# Patient Record
Sex: Female | Born: 1985 | Race: Black or African American | Hispanic: No | Marital: Single | Smoking: Never smoker
Health system: Southern US, Community
[De-identification: ages and names within clinical notes are randomized; demographics above are authoritative.]

## PROBLEM LIST (undated history)

## (undated) DIAGNOSIS — G43909 Migraine, unspecified, not intractable, without status migrainosus: Secondary | ICD-10-CM

## (undated) HISTORY — PX: MOUTH SURGERY: SHX715

---
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
## 2011-10-31 ENCOUNTER — Ambulatory Visit: Payer: 59 | Admitting: Family Medicine

## 2011-10-31 VITALS — BP 121/81 | HR 112 | Temp 98.2°F | Resp 16 | Ht 64.0 in | Wt 196.8 lb

## 2011-10-31 DIAGNOSIS — J019 Acute sinusitis, unspecified: Secondary | ICD-10-CM

## 2011-10-31 MED ORDER — AMOXICILLIN 500 MG PO CAPS: 1000.0000 mg | ORAL_CAPSULE | Freq: Two times a day (BID) | ORAL | Status: AC

## 2011-10-31 NOTE — Progress Notes (Signed)
  Patient Name: Sue Hickman Date of Birth: 1986-03-11 Medical Record Number: [id number] Gender: female Date of Encounter: 10/31/2011  History of Present Illness:  Sue Hickman is a 26 y.o. very pleasant female patient who presents with the following:  Here with illness- congestion in her sinuses, eyes hurt for the last week.  Also has hoarse voice for 3 days.  Today had a nose bleed that kept coming back.  She could control the bleeding in a minute or two.  Last bled a couple of hours ago.  Mild cough.  No fever that she had noticed, does have chills and body aches.  Did have a sore throat a few days ago.  No GI symptoms.  LMP = 10/10/11 Also requests FMLA paperwork- "I work for [employer] and they don't accept doctor's notes- only FMLA."  Is to RTW tomorrow after a vacation and will work tomorrow (saturday) through next Friday.  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   There is no problem list on file for this patient.  History reviewed. No pertinent past medical history. Past Surgical History  Procedure Date  . Mouth surgery    History  Substance Use Topics  . Smoking status: Never Smoker   . Smokeless tobacco: Never Used  . Alcohol Use: No   History reviewed. No pertinent family history. No Known Allergies  Medication list has been reviewed and updated.  Review of Systems: As per HPI- otherwise negative.  No other bleeding such as bleeding gums or easy bruising.    Physical Examination: Filed Vitals:   10/31/11 1428  BP: 121/81  Pulse: 112  Temp: 98.2 F (36.8 C)  TempSrc: Oral  Resp: 16  Height: 5\' 4"  (1.626 m)  Weight: 196 lb 12.8 oz (89.268 kg)  SpO2: 100%   Pulse 90 at my exam Body mass index is 33.78 kg/(m^2).  GEN: WDWN, NAD, Non-toxic, A & O x 3, obese HEENT: Atraumatic, Normocephalic. Neck supple. No masses, No LAD.  Oropharynx wnl, tender at frontal sinuses bilaterally Ears and Nose: No external  deformity.  Nasal cavity ok, TM wnl bilaterally CV: RRR, No M/G/R. No JVD. No thrill. No extra heart sounds. PULM: CTA B, no wheezes, crackles, rhonchi. No retractions. No resp. distress. No accessory muscle use. EXTR: No c/c/e NEURO Normal gait.  PSYCH: Normally interactive. Conversant. Not depressed or anxious appearing.  Calm demeanor.    Assessment and Plan: 1. Sinusitis acute  amoxicillin (AMOXIL) 500 MG capsule   Treat for sinusitis as above.  She does not have a dangerous illness, but is incapacitated from her job because she cannot speak.  Her voice is very hoarse/ soft and difficult to hear.  I did do FMLA paperwork for her, with plans to RTW on 2/20.  She will come in for a recheck a day or two before her RTW date for a recheck.  If she has more trouble with nosebleeds or cannot control the bleeding come in or otherwise seek care.  Otherwise, vaseline as a moisturizer may be helpful for her nosebleeds.

## 2011-10-31 NOTE — Patient Instructions (Signed)
Rest, fluids, try mucinex to loosen mucus.  Use antibiotic as directed.

## 2011-11-03 ENCOUNTER — Ambulatory Visit: Payer: 59 | Admitting: Family Medicine

## 2011-11-03 VITALS — BP 137/83 | HR 102 | Temp 98.3°F | Resp 18

## 2011-11-03 DIAGNOSIS — J45909 Unspecified asthma, uncomplicated: Secondary | ICD-10-CM

## 2011-11-03 DIAGNOSIS — J9801 Acute bronchospasm: Secondary | ICD-10-CM

## 2011-11-03 MED ORDER — ALBUTEROL SULFATE HFA 108 (90 BASE) MCG/ACT IN AERS: 2.0000 | INHALATION_SPRAY | Freq: Four times a day (QID) | RESPIRATORY_TRACT | Status: DC | PRN

## 2011-11-03 NOTE — Progress Notes (Signed)
  Patient Name: Sue Hickman Date of Birth: 17-Mar-1986 Medical Record Number: [id number] Gender: female Date of Encounter: 11/03/2011  History of Present Illness:  Sue Hickman is a 26 y.o. very pleasant female patient who presents with the following:  Here to recheck illness. See last visit on 10/31/11. Feels overall better but notes that "my chest is hurting" for the last 2 days when she coughs or is exposed to cold air.  No fever that she has noticed.  No aches, few chills.  Still has cough.    A/P from last visit: Assessment and Plan:  1.  Sinusitis acute  amoxicillin (AMOXIL) 500 MG capsule    Treat for sinusitis as above. She does not have a dangerous illness, but is incapacitated from her job because she cannot speak. Her voice is very hoarse/ soft and difficult to hear. I did do FMLA paperwork for her, with plans to RTW on 2/20. She will come in for a recheck a day or two before her RTW date for a recheck. If she has more trouble with nosebleeds or cannot control the bleeding come in or otherwise seek care. Otherwise, vaseline as a moisturizer may be helpful for her nosebleeds.    There is no problem list on file for this patient.  History reviewed. No pertinent past medical history. Past Surgical History  Procedure Date  . Mouth surgery    History  Substance Use Topics  . Smoking status: Never Smoker   . Smokeless tobacco: Never Used  . Alcohol Use: No   History reviewed. No pertinent family history. No Known Allergies  Medication list has been reviewed and updated.  Review of Systems: As per HPI, otherwise negative  Physical Examination: Filed Vitals:   11/03/11 0837  BP: 137/83  Pulse: 102  Temp: 98.3 F (36.8 C)  TempSrc: Oral  Resp: 18  SpO2: 99%   Recheck pulse around 95 There is no height or weight on file to calculate BMI.  GEN: WDWN, NAD, Non-toxic, A & O x 3, obese HEENT: Atraumatic, Normocephalic. Neck supple. No masses, No LAD. Ears and  Nose: No external deformity. CV: RRR, No M/G/R. No JVD. No thrill. No extra heart sounds.  reproducable CP byu pressing on sternum.  PULM: CTA B, no wheezes, crackles, rhonchi. No retractions. No resp. distress. No accessory muscle use. EXTR: No c/c/e NEURO Normal gait.  PSYCH: Normally interactive. Conversant. Not depressed or anxious appearing.  Calm demeanor.    Assessment and Plan: Recovering from sinusits, some RAD due to illness and very cold weather.    Her voice is much better so she should be able to RTW on 2/20 as planned.   1. RAD (reactive airway disease)  albuterol (PROVENTIL HFA;VENTOLIN HFA) 108 (90 BASE) MCG/ACT inhaler   Let us know if not continuing to get better- Sooner if worse.

## 2012-01-26 ENCOUNTER — Ambulatory Visit (INDEPENDENT_AMBULATORY_CARE_PROVIDER_SITE_OTHER): Payer: 59 | Admitting: Family Medicine

## 2012-01-26 ENCOUNTER — Encounter: Payer: Self-pay | Admitting: Family Medicine

## 2012-01-26 VITALS — BP 136/80 | HR 92 | Temp 97.5°F | Resp 16 | Ht 64.0 in | Wt 199.0 lb

## 2012-01-26 DIAGNOSIS — R040 Epistaxis: Secondary | ICD-10-CM

## 2012-01-26 DIAGNOSIS — J069 Acute upper respiratory infection, unspecified: Secondary | ICD-10-CM

## 2012-01-26 DIAGNOSIS — J329 Chronic sinusitis, unspecified: Secondary | ICD-10-CM

## 2012-01-26 LAB — POCT CBC
HCT, POC: 36.6 % — AB (ref 37.7–47.9)
Hemoglobin: 11.7 g/dL — AB (ref 12.2–16.2)
Lymph, poc: 3 (ref 0.6–3.4)
MCH, POC: 27.3 pg (ref 27–31.2)
MCHC: 32 g/dL (ref 31.8–35.4)
MCV: 85.3 fL (ref 80–97)
MID (cbc): 0.8 (ref 0–0.9)
POC Granulocyte: 8.5 — AB (ref 2–6.9)
POC LYMPH PERCENT: 24.1 %L (ref 10–50)
POC MID %: 6.8 %M (ref 0–12)
Platelet Count, POC: 464 10*3/uL — AB (ref 142–424)
RBC: 4.29 M/uL (ref 4.04–5.48)
RDW, POC: 14.7 %
WBC: 12.3 10*3/uL — AB (ref 4.6–10.2)

## 2012-01-26 MED ORDER — AZITHROMYCIN 250 MG PO TABS: ORAL_TABLET | ORAL | Status: AC

## 2012-01-26 NOTE — Patient Instructions (Addendum)
Nosebleed Nosebleeds can be caused by many conditions including trauma, infections, polyps, foreign bodies, dry mucous membranes or climate, medications and air conditioning. Most nosebleeds occur in the front of the nose. It is because of this location that most nosebleeds can be controlled by pinching the nostrils gently and continuously. Do this for at least 10 to 20 minutes. The reason for this long continuous pressure is that you must hold it long enough for the blood to clot. If during that 10 to 20 minute time period, pressure is released, the process may have to be started again. The nosebleed may stop by itself, quit with pressure, need concentrated heating (cautery) or stop with pressure from packing. HOME CARE INSTRUCTIONS   If your nose was packed, try to maintain the pack inside until your caregiver removes it. If a gauze pack was used and it starts to fall out, gently replace or cut the end off. Do not cut if a balloon catheter was used to pack the nose. Otherwise, do not remove unless instructed.   Avoid blowing your nose for 12 hours after treatment. This could dislodge the pack or clot and start bleeding again.   If the bleeding starts again, sit up and bending forward, gently pinch the front half of your nose continuously for 20 minutes.   If bleeding was caused by dry mucous membranes, cover the inside of your nose every morning with a petroleum or antibiotic ointment. Use your little fingertip as an applicator. Do this as needed during dry weather. This will keep the mucous membranes moist and allow them to heal.   Maintain humidity in your home by using less air conditioning or using a humidifier.   Do not use aspirin or medications which make bleeding more likely. Your caregiver can give you recommendations on this.   Resume normal activities as able but try to avoid straining, lifting or bending at the waist for several days.   If the nosebleeds become recurrent and the cause  is unknown, your caregiver may suggest laboratory tests.  SEEK IMMEDIATE MEDICAL CARE IF:   Bleeding recurs and cannot be controlled.   There is unusual bleeding from or bruising on other parts of the body.   You have a fever.   Nosebleeds continue.   There is any worsening of the condition which originally brought you in.   You become lightheaded, feel faint, become sweaty or vomit blood.  MAKE SURE YOU:   Understand these instructions.   Will watch your condition.   Will get help right away if you are not doing well or get worse.  Document Released: 06/11/2005 Document Revised: 08/21/2011 Document Reviewed: 08/03/2009 [facility] Patient Information 2012 [place], [place].   Use Afrin 2 sprays each nostril twice daily for 3 days only.  Antibiotics for infection  Use a little polysporin in each side of the nose 2-3 times daily.

## 2012-01-26 NOTE — Progress Notes (Signed)
Subjective: Patient is here complaining of having a little upper [place] congestion sore throat for 2 days ago. Yesterday she got up he noticed and was working a lot. Last I started bleeding lateral might off and on. She comes in here with a nosebleed. She only had a nosebleed one of the time and that was with a sinus infection.  Objective: Blood pressure 132/84. Alert but anxious looking overweight Afro-American female. Nose has a little blood coming from both nares was present, but doesn't seem to be dripping out of her. Throat was clear. Neck supple without nodes.  Assessment:  Epistaxis URI  Plan: With it not bleeding actively right now I don't think any to pack it. We will give her a little time and we'll check CBC and proceed from there.  Will put a little afrin on cotton up each nares to try to open the nose and stop the bleed.  Afrin packing removed after about 8 minutes.  Opened up some. Not bleeding.  Results for orders placed in visit on 01/26/12  POCT CBC      Component Value Range   WBC 12.3 (*) 4.6 - 10.2 (K/uL)   Lymph, poc 3.0  0.6 - 3.4    POC LYMPH PERCENT 24.1  10 - 50 (%L)   MID (cbc) 0.8  0 - 0.9    POC MID % 6.8  0 - 12 (%M)   POC Granulocyte 8.5 (*) 2 - 6.9    Granulocyte percent 69.1  37 - 80 (%G)   RBC 4.29  4.04 - 5.48 (M/uL)   Hemoglobin 11.7 (*) 12.2 - 16.2 (g/dL)   HCT, POC 11.9 (*) 14.7 - 47.9 (%)   MCV 85.3  80 - 97 (fL)   MCH, POC 27.3  27 - 31.2 (pg)   MCHC 32.0  31.8 - 35.4 (g/dL)   RDW, POC 82.9     Platelet Count, POC 464 (*) 142 - 424 (K/uL)   MPV 7.8  0 - 99.8 (fL)    The nose seems to stop bleeding with the Afrin. Her white blood count is a little bit high indicating that she might have some sinusitis. I will treat her with antibiotics. Use OTC Afrin for 3 days only. Instructions on squeezing of nose but she believes.

## 2019-02-24 ENCOUNTER — Emergency Department (HOSPITAL_COMMUNITY)
Admission: EM | Admit: 2019-02-24 | Discharge: 2019-02-24 | Disposition: A | Discharge status: Home / Self Care | Payer: Self-pay | Attending: Emergency Medicine | Admitting: Emergency Medicine

## 2019-02-24 ENCOUNTER — Encounter (HOSPITAL_COMMUNITY): Payer: Self-pay

## 2019-02-24 ENCOUNTER — Other Ambulatory Visit: Payer: Self-pay

## 2019-02-24 DIAGNOSIS — R519 Headache, unspecified: Secondary | ICD-10-CM

## 2019-02-24 DIAGNOSIS — R112 Nausea with vomiting, unspecified: Secondary | ICD-10-CM | POA: Insufficient documentation

## 2019-02-24 DIAGNOSIS — R51 Headache: Secondary | ICD-10-CM | POA: Insufficient documentation

## 2019-02-24 HISTORY — DX: Migraine, unspecified, not intractable, without status migrainosus: G43.909

## 2019-02-24 NOTE — ED Notes (Signed)
Pt reports that earlier she had a migraine that was causing her to vomit. Pt denies pain currently and reports no symptoms. Requesting to be discharged.

## 2019-02-24 NOTE — ED Notes (Signed)
Pt discharged with all belongings. Discharge instructions reviewed with pt, pt verbalized understanding. Opportunity for questions provided. Pt ambulatory at discharge.

## 2019-02-24 NOTE — Discharge Instructions (Addendum)
Please read instructions below. 10 you taking over-the-counter medications as needed for your headaches. Schedule an appointment with your primary care provider to follow up on your headache and discuss preventative treatment. Return to the ER for severely worsening headache, vision changes, fever, weakness or numbness, or new or concerning symptoms.

## 2019-02-24 NOTE — ED Provider Notes (Signed)
[facility] EMERGENCY DEPARTMENT Provider Note   CSN: [id number] Arrival date & time: 02/24/19  2123    History   Chief Complaint Chief Complaint  Patient presents with  . Migraine    HPI Sue Hickman is a 33 y.o. female w PMHx migraine HA, presenting to the ED with complaint of headache that resolved prior to evaluation.  She states she had a headache that began yesterday, typical for her migraine headache, generalized, throbbing with associated photophobia, nausea.  She states this headache however she did have some vomiting with the nausea.  She took ibuprofen prior to arrival and upon evaluation she reports symptoms have completely resolved, she feels well and ready for discharge.  Her primary care manages her migraines, she has been prescribed tramadol for this as well as a nausea medicine, however she rarely needs this.  No diplopia or loss of vision.  No neck pain or stiffness.  No other complaints.     The history is provided by the patient.    Past Medical History:  Diagnosis Date  . Migraine     There are no active problems to display for this patient.   Past Surgical History:  Procedure Laterality Date  . MOUTH SURGERY       OB History   No obstetric history on file.      Home Medications    Prior to Admission medications   Medication Sig Start Date End Date Taking? Authorizing Provider  acetaminophen (TYLENOL) 500 MG tablet Take 500 mg by mouth every 6 (six) hours as needed.    [provider]  albuterol (PROVENTIL HFA;VENTOLIN HFA) 108 (90 BASE) MCG/ACT inhaler Inhale 2 puffs into the lungs every 6 (six) hours as needed for wheezing. 11/03/11 11/02/12  Copland, Gay Filler, MD  dextromethorphan-guaiFENesin (MUCINEX DM) 30-600 MG per 12 hr tablet Take 1 tablet by mouth every 12 (twelve) hours.    [provider]    Family History No family history on file.  Social History Social History   Tobacco Use  . Smoking  status: Never Smoker  . Smokeless tobacco: Never Used  Substance Use Topics  . Alcohol use: Yes    Comment: Socially   . Drug use: No     Allergies   Patient has no known allergies.   Review of Systems Review of Systems  Eyes: Positive for photophobia. Negative for visual disturbance.  Gastrointestinal: Positive for nausea and vomiting.  Neurological: Positive for headaches.  All other systems reviewed and are negative.    Physical Exam Updated Vital Signs BP (!) 146/95 (BP Location: Left Arm)   Pulse 82   Temp 98.7 F (37.1 C) (Oral)   Resp 18   Ht 5\' 3"  (1.6 m)   Wt 90.7 kg   SpO2 98%   BMI 35.43 kg/m   Physical Exam Vitals signs and nursing note reviewed.  Constitutional:      General: She is not in acute distress.    Appearance: She is well-developed. She is not ill-appearing.  HENT:     Head: Normocephalic and atraumatic.  Eyes:     Extraocular Movements: Extraocular movements intact.     Conjunctiva/sclera: Conjunctivae normal.  Cardiovascular:     Rate and Rhythm: Normal rate and regular rhythm.  Pulmonary:     Effort: Pulmonary effort is normal. No respiratory distress.  Abdominal:     Palpations: Abdomen is soft.  Skin:    General: Skin is warm.  Neurological:  Mental Status: She is alert.  Psychiatric:        Behavior: Behavior normal.      ED Treatments / Results  Labs (all labs ordered are listed, but only abnormal results are displayed) Labs Reviewed - No data to display  EKG None  Radiology No results found.  Procedures Procedures (including critical care time)  Medications Ordered in ED Medications - No data to display   Initial Impression / Assessment and Plan / ED Course  I have reviewed the triage vital signs and the nursing notes.  Pertinent labs & imaging results that were available during my care of the patient were reviewed by me and considered in my medical decision making (see chart for details).         Patient presenting with complaint of migraine headache, however resolved her to my evaluation.  Treated at home with ibuprofen prior to arrival.  No current complaints.  Patient declined examination, states she is ready for discharge.  Instructed PCP follow-up.  Return precautions.  Safe for discharge.  Discussed results, findings, treatment and follow up. Patient advised of return precautions. Patient verbalized understanding and agreed with plan.   Final Clinical Impressions(s) / ED Diagnoses   Final diagnoses:  Acute nonintractable headache, unspecified headache type    ED Discharge Orders    None       , [place] N, PA-C 02/24/19 2234    Little, Ambrose Finlandachel Morgan, MD 02/25/19 0009

## 2019-02-24 NOTE — ED Triage Notes (Signed)
Pt in POV reporting migraine X1 day. Episode of N/V PTA, photosensitivity. Pt states hx of migraines, has Rx for Tramadol but has not taken it, did try Advil PTA.

## 2019-03-04 ENCOUNTER — Emergency Department (HOSPITAL_COMMUNITY): Payer: BC Managed Care – PPO

## 2019-03-04 ENCOUNTER — Emergency Department (HOSPITAL_COMMUNITY)
Admission: EM | Admit: 2019-03-04 | Discharge: 2019-03-04 | Disposition: A | Discharge status: Home / Self Care | Payer: BC Managed Care – PPO | Attending: Emergency Medicine | Admitting: Emergency Medicine

## 2019-03-04 ENCOUNTER — Encounter (HOSPITAL_COMMUNITY): Payer: Self-pay

## 2019-03-04 ENCOUNTER — Other Ambulatory Visit: Payer: Self-pay

## 2019-03-04 DIAGNOSIS — Z20828 Contact with and (suspected) exposure to other viral communicable diseases: Secondary | ICD-10-CM | POA: Diagnosis not present

## 2019-03-04 DIAGNOSIS — R509 Fever, unspecified: Secondary | ICD-10-CM | POA: Insufficient documentation

## 2019-03-04 DIAGNOSIS — B349 Viral infection, unspecified: Secondary | ICD-10-CM | POA: Diagnosis not present

## 2019-03-04 DIAGNOSIS — M791 Myalgia, unspecified site: Secondary | ICD-10-CM | POA: Diagnosis not present

## 2019-03-04 DIAGNOSIS — R070 Pain in throat: Secondary | ICD-10-CM | POA: Diagnosis not present

## 2019-03-04 DIAGNOSIS — R05 Cough: Secondary | ICD-10-CM | POA: Diagnosis present

## 2019-03-04 LAB — TROPONIN I: Troponin I: <0.03 ng/mL (ref <0.03)

## 2019-03-04 LAB — BASIC METABOLIC PANEL
Anion gap: 13 (ref 5–15)
BUN: 7 mg/dL (ref 6–20)
CO2: 21 mmol/L — ABNORMAL LOW (ref 22–32)
Calcium: 9.7 mg/dL (ref 8.9–10.3)
Chloride: 105 mmol/L (ref 98–111)
Creatinine, Ser: 0.97 mg/dL (ref 0.44–1.00)
GFR calc Af Amer: >60 mL/min (ref >60)
GFR calc non Af Amer: >60 mL/min (ref >60)
Glucose, Bld: 138 mg/dL — ABNORMAL HIGH (ref 70–99)
Potassium: 4.6 mmol/L (ref 3.5–5.1)
Sodium: 139 mmol/L (ref 135–145)

## 2019-03-04 LAB — CBC
HCT: 41.4 % (ref 36.0–46.0)
Hemoglobin: 13.6 g/dL (ref 12.0–15.0)
MCH: 29.1 pg (ref 26.0–34.0)
MCHC: 32.9 g/dL (ref 30.0–36.0)
MCV: 88.5 fL (ref 80.0–100.0)
Platelets: 397 10*3/uL (ref 150–400)
RBC: 4.68 MIL/uL (ref 3.87–5.11)
RDW: 12.7 % (ref 11.5–15.5)
WBC: 16.7 10*3/uL — ABNORMAL HIGH (ref 4.0–10.5)
nRBC: 0 % (ref 0.0–0.2)

## 2019-03-04 LAB — I-STAT BETA HCG BLOOD, ED (MC, WL, AP ONLY): I-stat hCG, quantitative: <5 m[IU]/mL (ref <5)

## 2019-03-04 LAB — GROUP A STREP BY PCR: Group A Strep by PCR: NOT DETECTED

## 2019-03-04 MED ORDER — ACETAMINOPHEN 325 MG PO TABS
650.0000 mg | ORAL_TABLET | Freq: Once | ORAL | Status: AC
  Administered 2019-03-04: 650 mg via ORAL
  Filled 2019-03-04: qty 2

## 2019-03-04 MED ORDER — SODIUM CHLORIDE 0.9% FLUSH
3.0000 mL | Freq: Once | INTRAVENOUS | Status: AC
  Administered 2019-03-04: 21:00:00 3 mL via INTRAVENOUS

## 2019-03-04 MED ORDER — SODIUM CHLORIDE 0.9 % IV BOLUS
1000.0000 mL | Freq: Once | INTRAVENOUS | Status: AC
  Administered 2019-03-04: 1000 mL via INTRAVENOUS

## 2019-03-04 NOTE — ED Notes (Signed)
Patient verbalizes understanding of discharge instructions. Opportunity for questioning and answers were provided. Armband removed by staff, pt discharged from ED.  

## 2019-03-04 NOTE — ED Triage Notes (Signed)
Pt reports generalized body aches, sore throat and fever for the past 2 days.

## 2019-03-04 NOTE — ED Provider Notes (Signed)
[facility] EMERGENCY DEPARTMENT Provider Note   CSN: [id number] Arrival date & time: 03/04/19  1938    History   Chief Complaint Chief Complaint  Patient presents with  . Generalized Body Aches  . Fever  . Sore Throat    HPI Sue Hickman is a 33 y.o. female.     HPI Pt started having sx a few days ago.   She has been coughing, having body aches, sore throat and today she started with a fever.No dysuria.   No frequency.  No abdominal pain.  No vomiting, some loose stools a couple of days ago.   Nothing today.  Pt denies any known ill contacts.  No menses.  She is on nexplanon. Past Medical History:  Diagnosis Date  . Migraine     There are no active problems to display for this patient.   Past Surgical History:  Procedure Laterality Date  . MOUTH SURGERY       OB History   No obstetric history on file.      Home Medications    Prior to Admission medications   Medication Sig Start Date End Date Taking? Authorizing Provider  acetaminophen (TYLENOL) 500 MG tablet Take 500 mg by mouth every 6 (six) hours as needed.    [provider]  albuterol (PROVENTIL HFA;VENTOLIN HFA) 108 (90 BASE) MCG/ACT inhaler Inhale 2 puffs into the lungs every 6 (six) hours as needed for wheezing. 11/03/11 11/02/12  Copland, Gay Filler, MD  dextromethorphan-guaiFENesin (MUCINEX DM) 30-600 MG per 12 hr tablet Take 1 tablet by mouth every 12 (twelve) hours.    [provider]    Family History No family history on file.  Social History Social History   Tobacco Use  . Smoking status: Never Smoker  . Smokeless tobacco: Never Used  Substance Use Topics  . Alcohol use: Yes    Comment: Socially   . Drug use: No     Allergies   Patient has no known allergies.   Review of Systems Review of Systems  All other systems reviewed and are negative.    Physical Exam Updated Vital Signs BP (!) 133/91   Pulse (!) 101   Temp 99.3 F (37.4 C)  (Oral)   Resp 17   SpO2 99%   Physical Exam Vitals signs and nursing note reviewed.  Constitutional:      General: She is not in acute distress.    Appearance: She is well-developed.  HENT:     Head: Normocephalic and atraumatic.     Right Ear: External ear normal.     Left Ear: External ear normal.     Mouth/Throat:     Mouth: Mucous membranes are moist.     Pharynx: No pharyngeal swelling, oropharyngeal exudate, posterior oropharyngeal erythema or uvula swelling.  Eyes:     General: No scleral icterus.       Right eye: No discharge.        Left eye: No discharge.     Conjunctiva/sclera: Conjunctivae normal.  Neck:     Musculoskeletal: Neck supple.     Trachea: No tracheal deviation.  Cardiovascular:     Rate and Rhythm: Normal rate and regular rhythm.  Pulmonary:     Effort: Pulmonary effort is normal. No respiratory distress.     Breath sounds: Normal breath sounds. No stridor. No wheezing or rales.  Abdominal:     General: Bowel sounds are normal. There is no distension.  Palpations: Abdomen is soft.     Tenderness: There is no abdominal tenderness. There is no guarding or rebound.  Musculoskeletal:        General: No tenderness.  Skin:    General: Skin is warm and dry.     Findings: No rash.  Neurological:     Mental Status: She is alert.     Cranial Nerves: No cranial nerve deficit (no facial droop, extraocular movements intact, no slurred speech).     Sensory: No sensory deficit.     Motor: No abnormal muscle tone or seizure activity.     Coordination: Coordination normal.      ED Treatments / Results  Labs (all labs ordered are listed, but only abnormal results are displayed) Labs Reviewed  BASIC METABOLIC PANEL - Abnormal; Notable for the following components:      Result Value   CO2 21 (*)    Glucose, Bld 138 (*)    All other components within normal limits  CBC - Abnormal; Notable for the following components:   WBC 16.7 (*)    All other  components within normal limits  GROUP A STREP BY PCR  NOVEL CORONAVIRUS, NAA (HOSPITAL ORDER, SEND-OUT TO REF LAB)  TROPONIN I  I-STAT BETA HCG BLOOD, ED ([facility], [facility], [facility] ONLY)    EKG EKG Interpretation  Date/Time:  Friday March 04 2019 19:48:44 EDT Ventricular Rate:  134 PR Interval:  124 QRS Duration: 70 QT Interval:  306 QTC Calculation: 456 R Axis:   78 Text Interpretation:  Sinus tachycardia Possible Left atrial enlargement Borderline ECG No old tracing to compare Confirmed by Linwood DibblesKnapp, Reshanda Lewey [phone]([id number]) on 03/04/2019 8:18:22 PM   Radiology Dg Chest Portable 1 View  Result Date: 03/04/2019 CLINICAL DATA:  Chest tightness EXAM: PORTABLE CHEST 1 VIEW COMPARISON:  08/31/2004 FINDINGS: The heart size and mediastinal contours are within normal limits. Both lungs are clear. The visualized skeletal structures are unremarkable. IMPRESSION: No active disease. Electronically Signed   By: Jasmine PangKim  Fujinaga M.D.   On: 03/04/2019 20:52    Procedures Procedures (including critical care time)  Medications Ordered in ED Medications  sodium chloride flush (NS) 0.9 % injection 3 mL (3 mLs Intravenous Given 03/04/19 2119)  acetaminophen (TYLENOL) tablet 650 mg (650 mg Oral Given 03/04/19 1958)  sodium chloride 0.9 % bolus 1,000 mL (0 mLs Intravenous Stopped 03/04/19 2313)     Initial Impression / Assessment and Plan / ED Course  I have reviewed the triage vital signs and the nursing notes.  Pertinent labs & imaging results that were available during my care of the patient were reviewed by me and considered in my medical decision making (see chart for details).   Patient presented to the emergency room for evaluation of fever cough sore throat and congestion.  Labs are notable for an elevated white blood cell count but otherwise her test results were unremarkable.  Chest x-ray without signs of pneumonia.  Symptoms most likely related to a viral infection.  Certainly cannot exclude the possibility of covid 19.   A swab was sent off for analysis.  Patient was cautioned to quarantine herself.  Take Tylenol as needed for fever.  Warning signs precautions discussed.  Sue Hickman was evaluated in Emergency Department on 03/04/2019 for the symptoms described in the history of present illness. She was evaluated in the context of the global COVID-19 pandemic, which necessitated consideration that the patient might be at risk for infection with the SARS-CoV-2 virus that causes  COVID-19. Institutional protocols and algorithms that pertain to the evaluation of patients at risk for COVID-19 are in a state of rapid change based on information released by regulatory bodies including the CDC and federal and state organizations. These policies and algorithms were followed during the patient's care in the ED.    Final Clinical Impressions(s) / ED Diagnoses   Final diagnoses:  Viral syndrome    ED Discharge Orders    None       Linwood DibblesKnapp, Niki Payment, MD 03/04/19 2316

## 2019-03-04 NOTE — Discharge Instructions (Signed)
Take Tylenol as needed for fever, drink plenty of fluids, please make sure to quarantine yourself.  Your covid test should be back in the next 24 to 48 hours.

## 2019-03-06 LAB — NOVEL CORONAVIRUS, NAA (HOSP ORDER, SEND-OUT TO REF LAB; TAT 18-24 HRS): SARS-CoV-2, NAA: NOT DETECTED

## 2019-12-04 ENCOUNTER — Emergency Department (HOSPITAL_COMMUNITY): Payer: Self-pay

## 2019-12-04 ENCOUNTER — Emergency Department (HOSPITAL_COMMUNITY)
Admission: EM | Admit: 2019-12-04 | Discharge: 2019-12-05 | Disposition: A | Discharge status: Home / Self Care | Payer: Self-pay | Attending: Emergency Medicine | Admitting: Emergency Medicine

## 2019-12-04 ENCOUNTER — Other Ambulatory Visit: Payer: Self-pay

## 2019-12-04 ENCOUNTER — Encounter (HOSPITAL_COMMUNITY): Payer: Self-pay | Admitting: Emergency Medicine

## 2019-12-04 DIAGNOSIS — R0602 Shortness of breath: Secondary | ICD-10-CM

## 2019-12-04 DIAGNOSIS — J1282 Pneumonia due to coronavirus disease 2019: Secondary | ICD-10-CM | POA: Insufficient documentation

## 2019-12-04 DIAGNOSIS — U071 COVID-19: Secondary | ICD-10-CM | POA: Insufficient documentation

## 2019-12-04 LAB — COMPREHENSIVE METABOLIC PANEL
ALT: 201 U/L — ABNORMAL HIGH (ref 0–44)
AST: 158 U/L — ABNORMAL HIGH (ref 15–41)
Albumin: 3.4 g/dL — ABNORMAL LOW (ref 3.5–5.0)
Alkaline Phosphatase: 123 U/L (ref 38–126)
Anion gap: 13 (ref 5–15)
BUN: 6 mg/dL (ref 6–20)
CO2: 24 mmol/L (ref 22–32)
Calcium: 8.5 mg/dL — ABNORMAL LOW (ref 8.9–10.3)
Chloride: 101 mmol/L (ref 98–111)
Creatinine, Ser: 0.72 mg/dL (ref 0.44–1.00)
GFR calc Af Amer: >60 mL/min (ref >60)
GFR calc non Af Amer: >60 mL/min (ref >60)
Glucose, Bld: 104 mg/dL — ABNORMAL HIGH (ref 70–99)
Potassium: 3.5 mmol/L (ref 3.5–5.1)
Sodium: 138 mmol/L (ref 135–145)
Total Bilirubin: 0.5 mg/dL (ref 0.3–1.2)
Total Protein: 7.2 g/dL (ref 6.5–8.1)

## 2019-12-04 LAB — I-STAT BETA HCG BLOOD, ED (MC, WL, AP ONLY): I-stat hCG, quantitative: <5 m[IU]/mL (ref <5)

## 2019-12-04 LAB — CBC
HCT: 43.3 % (ref 36.0–46.0)
Hemoglobin: 14.5 g/dL (ref 12.0–15.0)
MCH: 29 pg (ref 26.0–34.0)
MCHC: 33.5 g/dL (ref 30.0–36.0)
MCV: 86.6 fL (ref 80.0–100.0)
Platelets: 287 10*3/uL (ref 150–400)
RBC: 5 MIL/uL (ref 3.87–5.11)
RDW: 12.7 % (ref 11.5–15.5)
WBC: 6.6 10*3/uL (ref 4.0–10.5)
nRBC: 0 % (ref 0.0–0.2)

## 2019-12-04 MED ORDER — IOHEXOL 350 MG/ML SOLN
75.0000 mL | Freq: Once | INTRAVENOUS | Status: AC | PRN
  Administered 2019-12-04: 75 mL via INTRAVENOUS

## 2019-12-04 NOTE — ED Notes (Signed)
Called to CT to confirm that pt is marked as ready. CT confirmed she is on their list.

## 2019-12-04 NOTE — ED Triage Notes (Signed)
Pt c/o increased shortness of breath, worse with exertion and chest tightness. Tested +covid 3/11.

## 2019-12-04 NOTE — ED Notes (Signed)
Pt transported to CT ?

## 2019-12-04 NOTE — ED Provider Notes (Signed)
[facility] EMERGENCY DEPARTMENT Provider Note   CSN: [id number] Arrival date & time: 12/04/19  [place]     History Chief Complaint  Patient presents with  . Shortness of Breath    Sue Hickman is a 34 y.o. female.  HPI Patient is a 34 year old female tested Covid positive 10 days ago presenting to the ED today due to chest tightness and shortness of breath.  Patient says that for the past 10 days, she has experienced fever, chills, nausea, dry cough and headache.  However, earlier in the day, she developed chest tightness and shortness of breath that began all of a sudden.  The chest tightness is localized to the center of her chest and worsens with deep breathing and exertion.  She has also experienced decreased appetite and frequently vomits after eating.  She denies diarrhea.    Past Medical History:  Diagnosis Date  . Migraine     There are no problems to display for this patient.   Past Surgical History:  Procedure Laterality Date  . MOUTH SURGERY       OB History   No obstetric history on file.     No family history on file.  Social History   Tobacco Use  . Smoking status: Never Smoker  . Smokeless tobacco: Never Used  Substance Use Topics  . Alcohol use: Yes    Comment: Socially   . Drug use: No    Home Medications Prior to Admission medications   Medication Sig Start Date End Date Taking? Authorizing Provider  acetaminophen (TYLENOL) 500 MG tablet Take 500 mg by mouth every 6 (six) hours as needed.    [provider]  albuterol (PROVENTIL HFA;VENTOLIN HFA) 108 (90 BASE) MCG/ACT inhaler Inhale 2 puffs into the lungs every 6 (six) hours as needed for wheezing. 11/03/11 11/02/12  Copland, Gay Filler, MD  dextromethorphan-guaiFENesin (MUCINEX DM) 30-600 MG per 12 hr tablet Take 1 tablet by mouth every 12 (twelve) hours.    [provider]    Allergies    Patient has no known allergies.  Review of Systems   Review of  Systems  Constitutional: Positive for appetite change, chills and fever.  HENT: Negative for rhinorrhea and sore throat.   Eyes: Negative for photophobia and visual disturbance.  Respiratory: Positive for cough, chest tightness and shortness of breath.   Cardiovascular: Negative for chest pain and palpitations.  Gastrointestinal: Positive for nausea and vomiting. Negative for abdominal pain.  Genitourinary: Negative for dysuria and hematuria.  Musculoskeletal: Negative for arthralgias and back pain.  Skin: Negative for color change and rash.  Neurological: Positive for headaches. Negative for weakness.  Psychiatric/Behavioral: Negative for agitation.  All other systems reviewed and are negative.   Physical Exam Updated Vital Signs BP (!) 138/96 (BP Location: Left Arm)   Pulse (!) 132   Temp 99.1 F (37.3 C) (Oral)   Resp 20   LMP 12/03/2019   SpO2 100%   Physical Exam Vitals and nursing note reviewed.  Constitutional:      General: She is not in acute distress.    Appearance: Normal appearance. She is well-developed. She is obese. She is ill-appearing.  HENT:     Head: Normocephalic and atraumatic.     Right Ear: External ear normal.     Left Ear: External ear normal.     Nose: Nose normal. No congestion or rhinorrhea.     Mouth/Throat:     Mouth: Mucous membranes are moist.  Pharynx: Oropharynx is clear.  Eyes:     Extraocular Movements: Extraocular movements intact.     Pupils: Pupils are equal, round, and reactive to light.  Cardiovascular:     Rate and Rhythm: Regular rhythm. Tachycardia present.     Pulses: Normal pulses.     Heart sounds: Normal heart sounds.  Pulmonary:     Effort: No respiratory distress.     Breath sounds: No stridor. Rhonchi present. No wheezing or rales.     Comments: Increased work of breathing. Abdominal:     General: There is no distension.     Palpations: Abdomen is soft.     Tenderness: There is no abdominal tenderness. There is  no guarding or rebound.  Musculoskeletal:        General: Normal range of motion.     Cervical back: Normal range of motion and neck supple.     Right lower leg: No edema.     Left lower leg: No edema.  Skin:    General: Skin is warm and dry.     Capillary Refill: Capillary refill takes less than 2 seconds.  Neurological:     General: No focal deficit present.     Mental Status: She is alert and oriented to person, place, and time. Mental status is at baseline.  Psychiatric:        Mood and Affect: Mood normal.     ED Results / Procedures / Treatments   Labs (all labs ordered are listed, but only abnormal results are displayed) Labs Reviewed  COMPREHENSIVE METABOLIC PANEL - Abnormal; Notable for the following components:      Result Value   Glucose, Bld 104 (*)    Calcium 8.5 (*)    Albumin 3.4 (*)    AST 158 (*)    ALT 201 (*)    All other components within normal limits  CBC  I-STAT BETA HCG BLOOD, ED ([facility], [facility], [facility] ONLY)    EKG EKG Interpretation  Date/Time:  Sunday December 04 2019 18:34:17 EDT Ventricular Rate:  128 PR Interval:  120 QRS Duration: 76 QT Interval:  318 QTC Calculation: 464 R Axis:   65 Text Interpretation: Sinus tachycardia Otherwise normal ECG Abnormal ECG Confirmed by Gerhard Munch [phone]) on 12/04/2019 7:24:30 PM   Radiology CT Angio Chest PE W and/or Wo Contrast  Result Date: 12/04/2019 CLINICAL DATA:  Shortness of breath, COVID positive EXAM: CT ANGIOGRAPHY CHEST WITH CONTRAST TECHNIQUE: Multidetector CT imaging of the chest was performed using the standard protocol during bolus administration of intravenous contrast. Multiplanar CT image reconstructions and MIPs were obtained to evaluate the vascular anatomy. CONTRAST:  49mL OMNIPAQUE IOHEXOL 350 MG/ML SOLN COMPARISON:  None. FINDINGS: Cardiovascular: There is a optimal opacification of the pulmonary arteries. There is no central,segmental filling defect seen. There is slightly suboptimal  evaluation of the subsegmental branches due to technique. The heart is normal in size. No pericardial effusion or thickening. No evidence right heart strain. There is normal three-vessel brachiocephalic anatomy without proximal stenosis. The thoracic aorta is normal in appearance. Mediastinum/Nodes: Scattered pretracheal and bilateral hilar lymph nodes are seen. The largest measuring 1 cm in the right hilar region. Thyroid gland, trachea, and esophagus demonstrate no significant findings. Lungs/Pleura: Extensive multifocal patchy ground-glass opacity are seen throughout both lungs, right greater than left. No pleural effusion is seen. No pneumothorax. Upper Abdomen: No acute abnormalities present in the visualized portions of the upper abdomen. Musculoskeletal: No chest wall abnormality. No acute or significant  osseous findings. Review of the MIP images confirms the above findings. IMPRESSION: 1. No central or segmental pulmonary embolism. Slightly suboptimal evaluation of the subsegmental branches due to technique. 2. Extensive multifocal patchy ground-glass opacities, right worse than left, consistent with viral pneumonia. Electronically Signed   By: Jonna Clark M.D.   On: 12/04/2019 23:53   DG Chest Portable 1 View  Result Date: 12/04/2019 CLINICAL DATA:  Initial evaluation for acute shortness of breath, cough. EXAM: PORTABLE CHEST 1 VIEW COMPARISON:  Prior radiograph from 08/31/2004. FINDINGS: Exaggeration of the cardiac silhouette related to AP technique and low lung volumes. Mediastinal silhouette normal. Lungs are hypoinflated. Patchy and hazy opacity seen involving the mid and lower lungs bilaterally, concerning for acute infectious pneumonitis. No edema or effusion. No pneumothorax. No acute osseous finding. IMPRESSION: Patchy and hazy opacities involving the mid and lower lungs bilaterally, concerning for acute infectious pneumonitis. Electronically Signed   By: Rise Mu M.D.   On:  12/04/2019 20:35    Procedures Procedures (including critical care time)  Medications Ordered in ED Medications  iohexol (OMNIPAQUE) 350 MG/ML injection 75 mL (75 mLs Intravenous Contrast Given 12/04/19 2337)    ED Course  I have reviewed the triage vital signs and the nursing notes.  Pertinent labs & imaging results that were available during my care of the patient were reviewed by me and considered in my medical decision making (see chart for details).    MDM Rules/Calculators/A&P                     Patient is a 34 year old female tested Covid positive 10 days ago presenting to the ED today due to chest tightness and shortness of breath.  On exam, patient has increased work of breathing with scattered rhonchi.  BP 138/96, HR 132, RR 20, SPO2 100% on room air.  Temp 99.5F.  On arrival, patient appears to have increased work of breathing; however, she has no signs of hypoxia and is able to carry a full conversation without difficulty.  Patient is on day 10 of Covid infection and says that her chest tightness and shortness of breath began all of a sudden today.  EKG with sinus tachycardia and no signs of acute ischemia.  Chest x-ray shows "Patchy and hazy opacities involving the mid and lower lungs bilaterally, concerning for acute infectious pneumonitis."  CBC unremarkable.  CMP with AST 158, ALT 201.  Given patient's acute onset of shortness of breath in the setting of COVID-19, will obtain CTA to evaluate for PE.  CTA PE study negative for pulmonary emboli.  On reassessment, patient appears more comfortable and says that she only experiences the chest pain when coughing.  Her heart rate has improved to 100 without intervention.  Patient likely experiencing Covid pneumonia.  Low suspicion for overlying bacterial pneumonia as she has no further fever and her cough is not productive.  No further work-up or intervention required while in the ED.  Patient stable for discharge.  Encouraged  patient to follow-up with her PCP in 2 to 3 days for follow-up.  Provided strict return precautions.  Patient stable at time of discharge.  Patient assessed and evaluated with Dr. Jeraldine Loots.  Delray Alt, MD   Final Clinical Impression(s) / ED Diagnoses Final diagnoses:  Shortness of breath  Pneumonia due to COVID-19 virus    Rx / DC Orders ED Discharge Orders    None       Delray Alt, MD 12/05/19 [phone]  Gerhard Munch, MD 12/06/19 [phone]

## 2019-12-05 NOTE — ED Notes (Signed)
Discharge instructions reviewed with pt. Pt verbalized understanding.   

## 2020-03-27 ENCOUNTER — Ambulatory Visit: Payer: Self-pay | Attending: Family

## 2020-03-27 DIAGNOSIS — Z23 Encounter for immunization: Secondary | ICD-10-CM

## 2020-03-27 NOTE — Progress Notes (Signed)
   Covid-19 Vaccination Clinic  Name:  Sue Hickman    MRN: [id number] DOB: 1985/10/21  03/27/2020  Ms. Sue Hickman was observed post Covid-19 immunization for 15 minutes without incident. She was provided with Vaccine Information Sheet and instruction to access the V-Safe system.   Ms. Sue Hickman was instructed to call 911 with any severe reactions post vaccine: Marland Kitchen Difficulty breathing  . Swelling of face and throat  . A fast heartbeat  . A bad rash all over body  . Dizziness and weakness   Immunizations Administered    Name Date Dose VIS Date Route   Pfizer COVID-19 Vaccine 03/27/2020  1:40 PM 0.3 mL 11/09/2018 Intramuscular   Manufacturer: [employer], [employer]   Lot: [id number]   NDC: [address]-1      Covid-19 Vaccination Clinic  Name:  Sue VESEY    MRN: [id number] DOB: Sep 16, 1985  03/27/2020  Ms. Sue Hickman was observed post Covid-19 immunization for 15 minutes without incident. She was provided with Vaccine Information Sheet and instruction to access the V-Safe system.   Ms. Sue Hickman was instructed to call 911 with any severe reactions post vaccine: Marland Kitchen Difficulty breathing  . Swelling of face and throat  . A fast heartbeat  . A bad rash all over body  . Dizziness and weakness   Immunizations Administered    Name Date Dose VIS Date Route   Pfizer COVID-19 Vaccine 03/27/2020  1:40 PM 0.3 mL 11/09/2018 Intramuscular   Manufacturer: [employer], [employer]   Lot: [id number]   NDC: [address]-4

## 2020-04-17 ENCOUNTER — Ambulatory Visit: Payer: Self-pay | Attending: Family

## 2020-07-19 ENCOUNTER — Ambulatory Visit: Payer: Self-pay | Attending: Family

## 2020-07-19 DIAGNOSIS — Z23 Encounter for immunization: Secondary | ICD-10-CM

## 2020-08-08 IMAGING — CT CT ANGIO CHEST
3 of 12 series · 18 of 46 positions shown · IV contrast (APPLIED)
Comparison: None.

CLINICAL DATA: Shortness of breath, COVID positive

EXAM:
CT ANGIOGRAPHY CHEST WITH CONTRAST
TECHNIQUE: Multidetector CT imaging of the chest was performed using the
standard protocol during bolus administration of intravenous
contrast. Multiplanar CT image reconstructions and MIPs were
obtained to evaluate the vascular anatomy.
CONTRAST:  75mL OMNIPAQUE IOHEXOL 350 MG/ML SOLN

[Series 6: thins · axial · 0.67mm/px · z∈[-15,+84]mm · 10 of 121 slices shown (1 of 2)]
[im 11/121  lung]
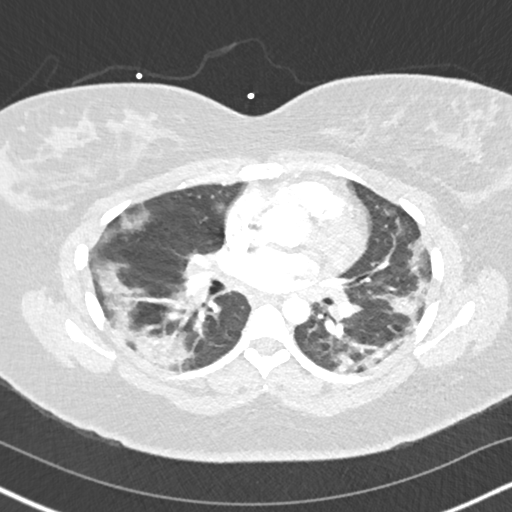
[im 22/121  soft-tissue]
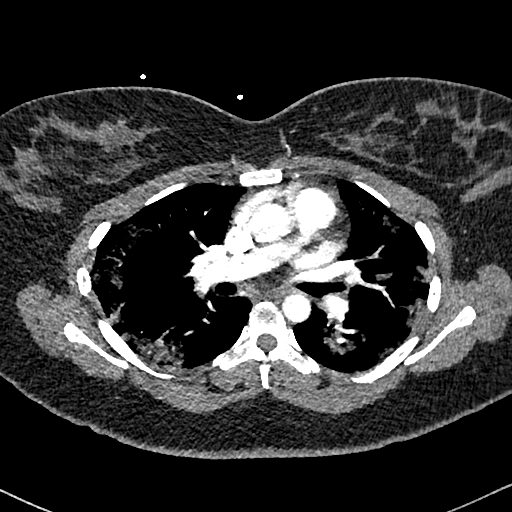
[im 33/121  lung]
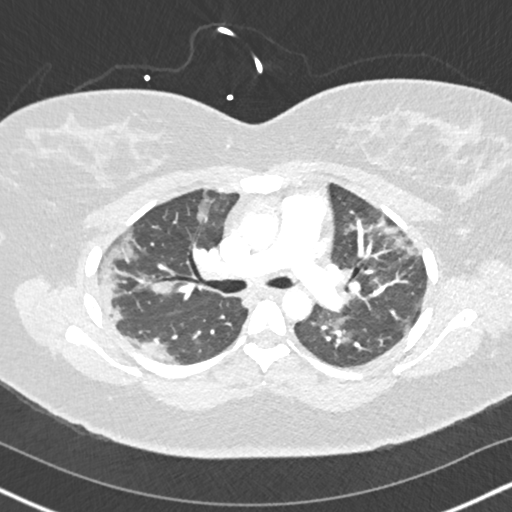
[im 44/121  soft-tissue]
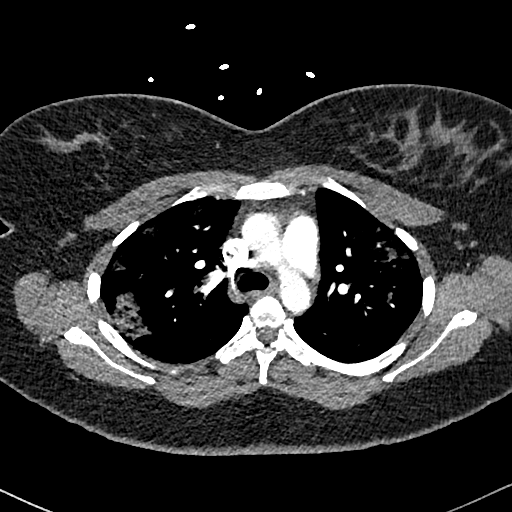
[im 55/121  lung]
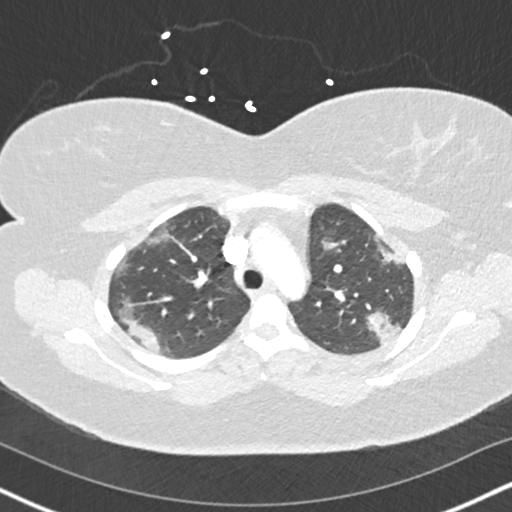
[im 66/121  soft-tissue]
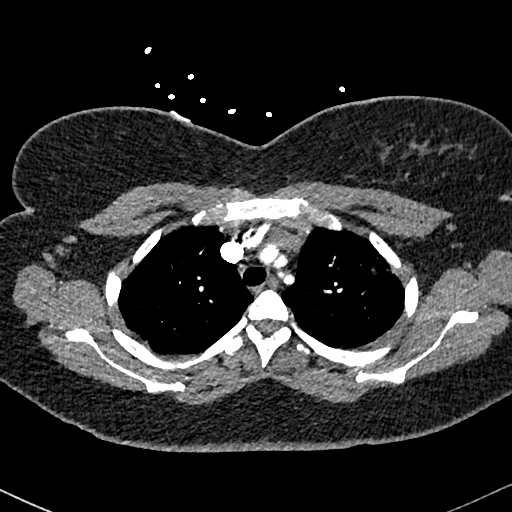
[im 77/121  lung]
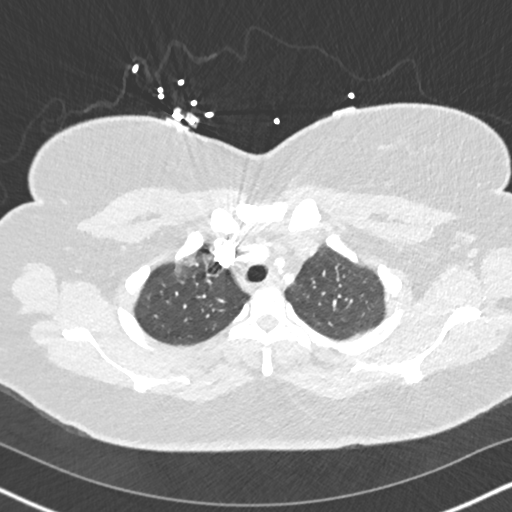
[im 88/121  soft-tissue]
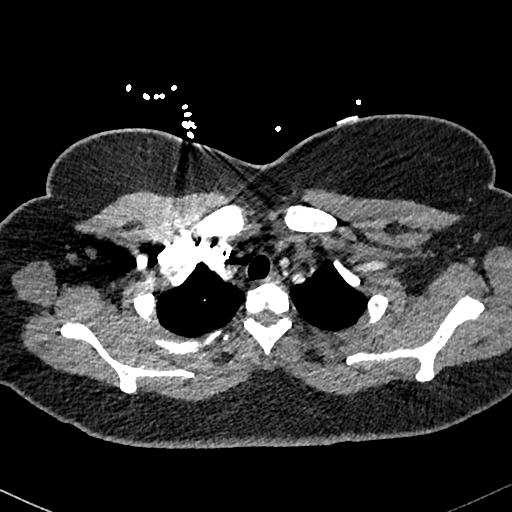
[im 99/121  lung]
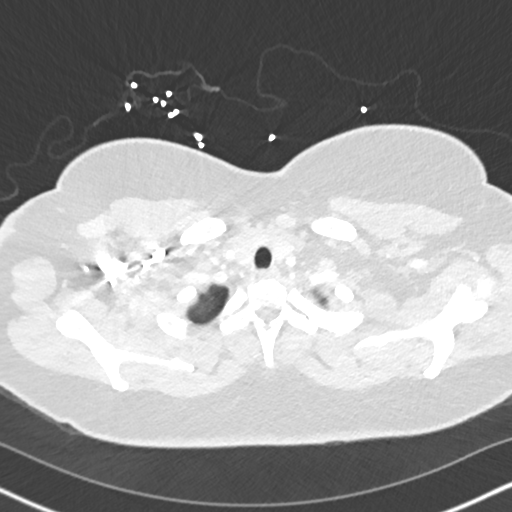
[im 110/121  soft-tissue]
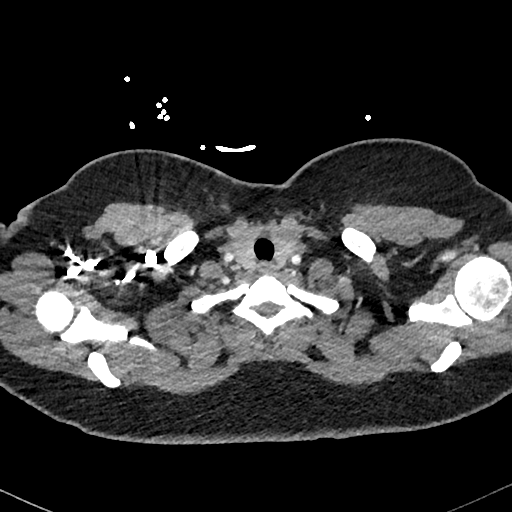

[Series 13: thins · axial · 0.67mm/px · z∈[-110,-22]mm · 7 of 121 slices shown (2 of 2)]
[im 11/121  lung]
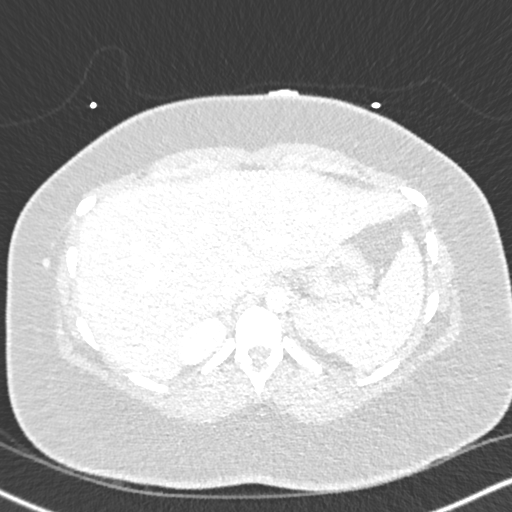
[im 22/121  lung]
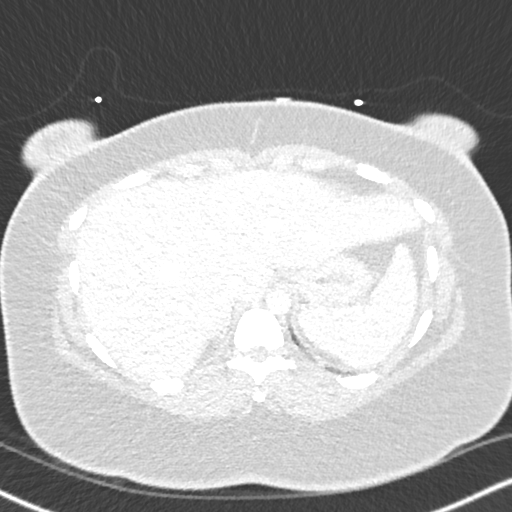
[im 44/121  lung]
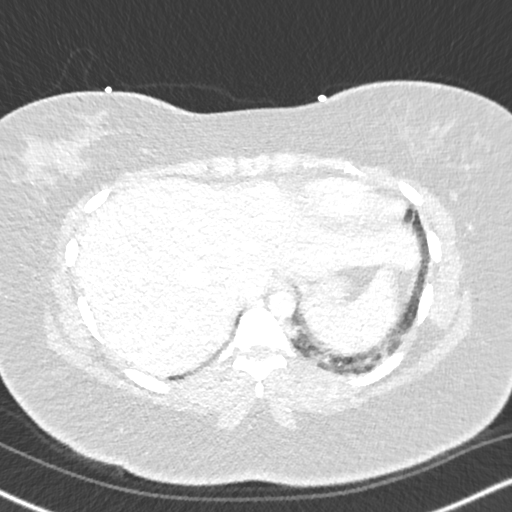
[im 55/121  lung]
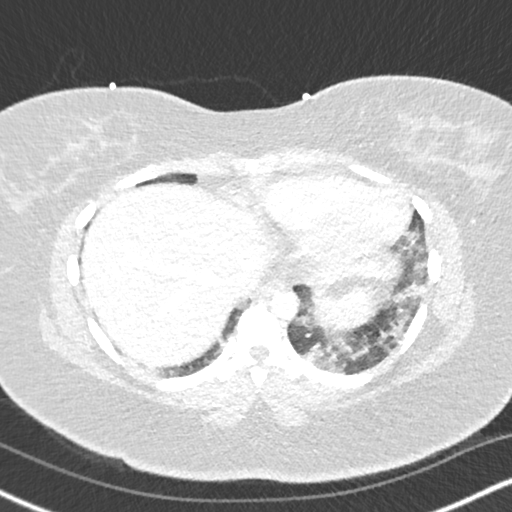
[im 66/121  lung]
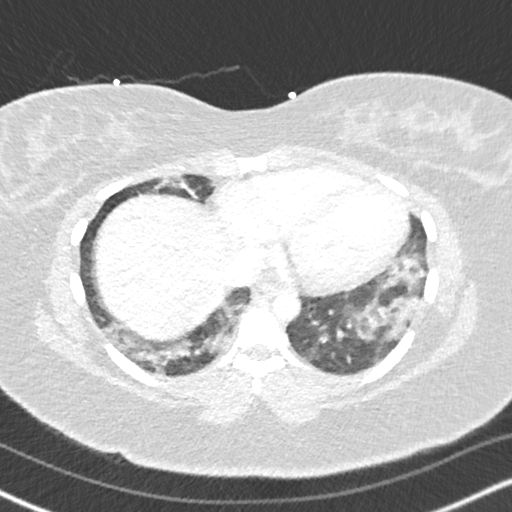
[im 77/121  lung]
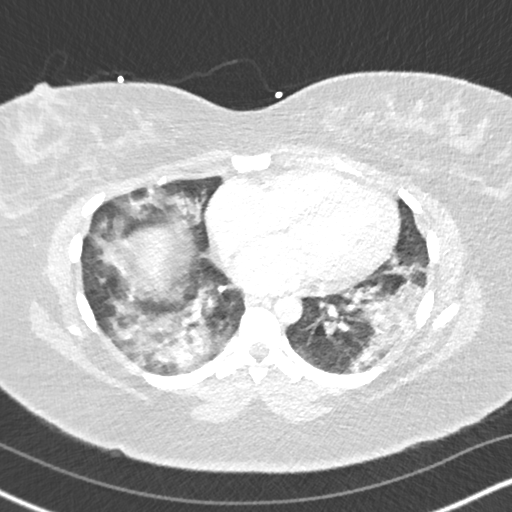
[im 99/121  lung]
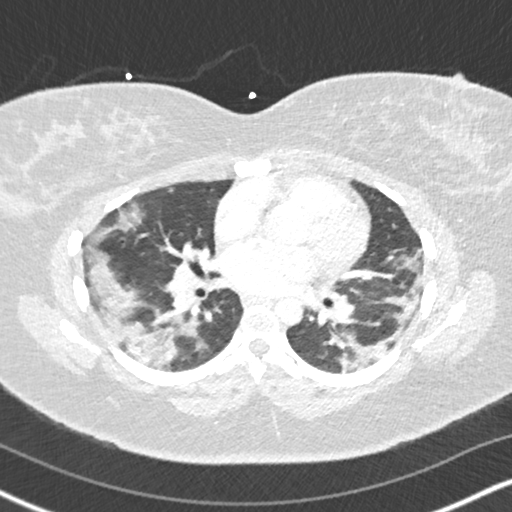

[Series 15: coronal mpr · coronal · 0.27mm/px · 1 of 151 slices shown]
[im 76/151  soft-tissue]
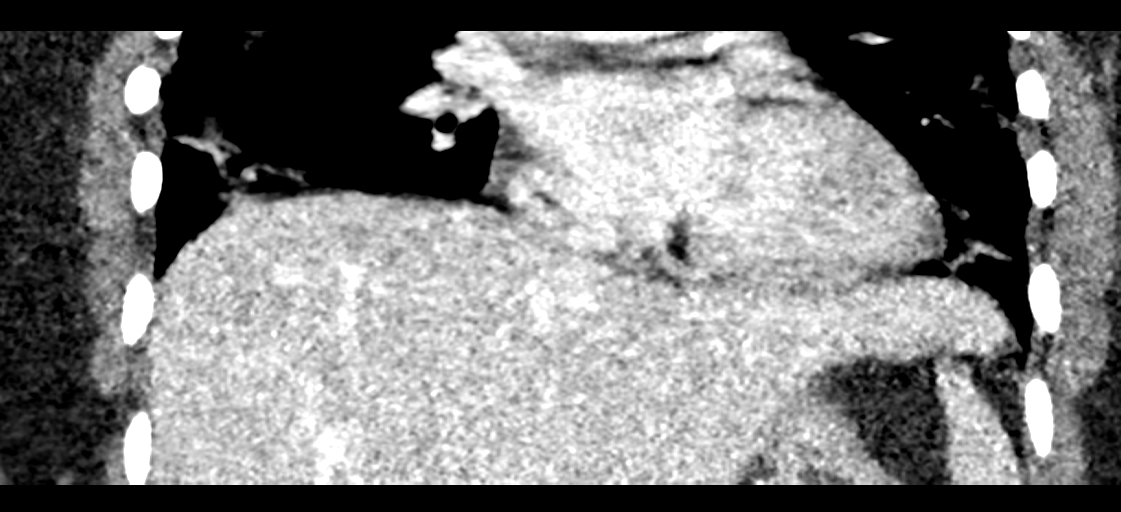

[18 of 46 positions shown; findings below may reference images not displayed]

FINDINGS: Cardiovascular: There is a optimal opacification of the pulmonary
arteries. There is no central,segmental filling defect seen. There
is slightly suboptimal evaluation of the subsegmental branches due
to technique. The heart is normal in size. No pericardial effusion
or thickening. No evidence right heart strain. There is normal
three-vessel brachiocephalic anatomy without proximal stenosis. The
thoracic aorta is normal in appearance.

Mediastinum/Nodes: Scattered pretracheal and bilateral hilar lymph
nodes are seen. The largest measuring 1 cm in the right hilar
region. Thyroid gland, trachea, and esophagus demonstrate no
significant findings.

Lungs/Pleura: Extensive multifocal patchy ground-glass opacity are
seen throughout both lungs, right greater than left. No pleural
effusion is seen. No pneumothorax.

Upper Abdomen: No acute abnormalities present in the visualized
portions of the upper abdomen.

Musculoskeletal: No chest wall abnormality. No acute or significant
osseous findings.

Review of the MIP images confirms the above findings.
IMPRESSION: 1. No central or segmental pulmonary embolism. Slightly suboptimal
evaluation of the subsegmental branches due to technique.
2. Extensive multifocal patchy ground-glass opacities, right worse
than left, consistent with viral pneumonia.

## 2020-08-08 IMAGING — DX DG CHEST 1V PORT
1 series · 1 of 1 positions shown · non-contrast
Comparison: Prior radiograph from 08/31/2004.

CLINICAL DATA: Initial evaluation for acute shortness of breath,
cough.

EXAM:
PORTABLE CHEST 1 VIEW

[chest]
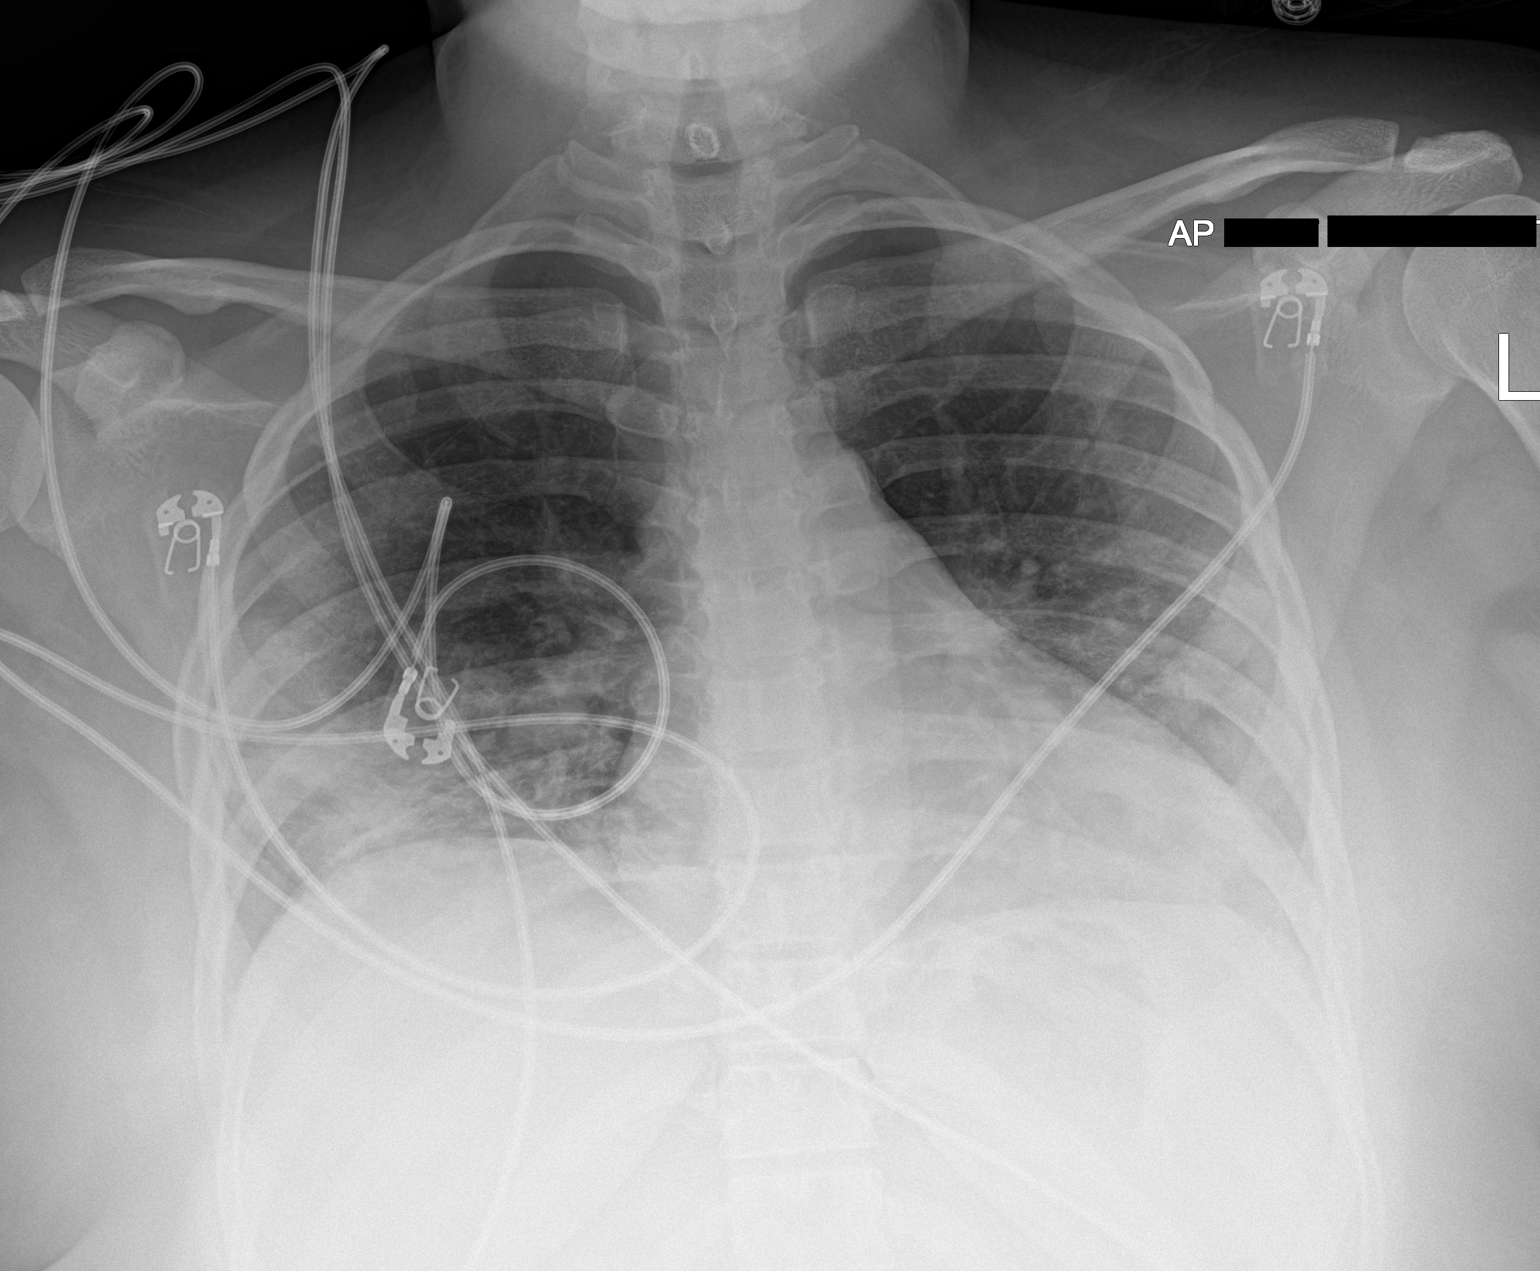

[1 of 1 positions shown; findings below may reference images not displayed]

FINDINGS: Exaggeration of the cardiac silhouette related to AP technique and
low lung volumes. Mediastinal silhouette normal.

Lungs are hypoinflated. Patchy and hazy opacity seen involving the
mid and lower lungs bilaterally, concerning for acute infectious
pneumonitis. No edema or effusion. No pneumothorax.

No acute osseous finding.
IMPRESSION: Patchy and hazy opacities involving the mid and lower lungs
bilaterally, concerning for acute infectious pneumonitis.

## 2020-10-02 NOTE — Progress Notes (Signed)
   Covid-19 Vaccination Clinic  Name:  Sue Hickman    MRN: [id number] DOB: 11/17/1985  10/02/2020  Sue Hickman was observed post Covid-19 immunization for 15 minutes without incident. She was provided with Vaccine Information Sheet and instruction to access the V-Safe system.   Sue Hickman was instructed to call 911 with any severe reactions post vaccine: Marland Kitchen Difficulty breathing  . Swelling of face and throat  . A fast heartbeat  . A bad rash all over body  . Dizziness and weakness

## 2022-12-22 LAB — AMB RESULTS CONSOLE CBG: Glucose: 87

## 2022-12-22 NOTE — Progress Notes (Signed)
Pt is not fasting . Pt needs PCP. 

## 2022-12-30 ENCOUNTER — Encounter: Payer: Self-pay | Admitting: *Deleted

## 2022-12-30 NOTE — Progress Notes (Signed)
Pt attended 12/22/22 screening event at [facility] where bp was 147/96. Pt states she does have PCP but she had not yet contacted her dr. She stated she intends to monitor her b/p over the next few days and then call PCP if b/p still elevated. Pt instructed to watch salt intake and take b/p when quiet and rested and then let PCP know all values. Pt agreed to f/u per these suggestions and did not have any SDOH or other healthcare access barriers at this time.

## 2023-02-05 ENCOUNTER — Encounter: Payer: Self-pay | Admitting: Plastic Surgery

## 2023-02-05 ENCOUNTER — Ambulatory Visit: Payer: BC Managed Care – PPO | Admitting: Plastic Surgery

## 2023-02-05 VITALS — BP 136/86 | HR 67 | Ht 65.0 in | Wt 208.0 lb

## 2023-02-05 DIAGNOSIS — M542 Cervicalgia: Secondary | ICD-10-CM | POA: Diagnosis not present

## 2023-02-05 DIAGNOSIS — L987 Excessive and redundant skin and subcutaneous tissue: Secondary | ICD-10-CM | POA: Diagnosis not present

## 2023-02-05 DIAGNOSIS — M25519 Pain in unspecified shoulder: Secondary | ICD-10-CM

## 2023-02-05 DIAGNOSIS — N62 Hypertrophy of breast: Secondary | ICD-10-CM

## 2023-02-05 DIAGNOSIS — E65 Localized adiposity: Secondary | ICD-10-CM | POA: Diagnosis not present

## 2023-02-05 DIAGNOSIS — M545 Low back pain, unspecified: Secondary | ICD-10-CM

## 2023-02-05 DIAGNOSIS — M546 Pain in thoracic spine: Secondary | ICD-10-CM

## 2023-02-05 NOTE — Progress Notes (Signed)
Referring Provider No referring provider defined for this encounter.   CC:  Chief Complaint  Patient presents with   Advice Only      Sue Hickman is an 37 y.o. female.  HPI: Ms. Mcaleese is a 37 year old female who presents today for evaluation for a bilateral breast reduction.  She states that she has been having upper back and neck pain for many years and feels that this is due to the large size of her breast.  She denies any other specific pain with her breast.  During her consultation she also inquired about extensive liposuction of the anterior abdominal wall and back.  Allergies  Allergen Reactions   Shellfish Allergy Rash    Outpatient Encounter Medications as of 02/05/2023  Medication Sig   etonogestrel (NEXPLANON) 68 MG IMPL implant 68 mg by subdermal route.   Ibuprofen-Acetaminophen (ADVIL DUAL ACTION) 125-250 MG TABS Take 1 tablet by mouth as needed (pain).   [DISCONTINUED] albuterol (PROVENTIL HFA;VENTOLIN HFA) 108 (90 BASE) MCG/ACT inhaler Inhale 2 puffs into the lungs every 6 (six) hours as needed for wheezing. (Patient not taking: Reported on 12/04/2019)   No facility-administered encounter medications on file as of 02/05/2023.     Past Medical History:  Diagnosis Date   Migraine     Past Surgical History:  Procedure Laterality Date   MOUTH SURGERY      No family history on file.  Social History   Social History Narrative   Not on file     Review of Systems General: Denies fevers, chills, weight loss CV: Denies chest pain, shortness of breath, palpitations Breast: Patient states that her breasts are contributing to her upper back and neck pain.  No nipple discharge or nipple abnormalities Anterior abdominal wall patient is unhappy with the appearance of her anterior abdominal wall and states that she has too much fat.  She states that she is unable to lose this through diet or exercise. Back patient is unhappy with the fact on her back especially the  midportion of her back  Physical Exam    02/05/2023   10:28 AM 12/22/2022    6:23 PM 12/05/2019   12:12 AM  Vitals with BMI  Height 5\' 5"     Weight 208 lbs    BMI 34.61    Systolic 136 147 098  Diastolic 86 96 82  Pulse 67 85 102    General:  No acute distress,  Alert and oriented, Non-Toxic, Normal speech and affect Breast: Patient has large breasts that are pendulous with grade 3 ptosis.  There are no dominant masses.  There is no nipple discharge.  There is no evidence of nipple abnormality.  The sternal notch to nipple distance on the right is 30 cm and 30 cm on the left her fold to nipple distance on the right is 12 cm and 13 cm on the left Abdomen: Patient has very thick subcutaneous fat with a small pannus which does not extend to the symphysis pubis.  She does not have any evidence of rashes. Back: Patient has multiple levels of subcutaneous fat.  Mammogram: Not applicable due to age Assessment/Plan Macromastia: Patient has very large breasts and would likely benefit from a bilateral breast reduction.  I believe that I can remove between 700 g and 800 g per breast.  We discussed the procedure at length including the location of the incisions and the unpredictable nature of scarring.  We discussed the risks of bleeding, infection, and seroma formation.  We discussed the use of drains postoperatively.  We discussed the risk of nipple loss due to nipple ischemia.  We discussed the postoperative limitations including no heavy lifting, no vigorous activity, no submerging the incisions in water for 6 weeks. Abdomen and back: Patient has extensive thick subcutaneous fat.  I told her that I do not do high-volume liposuction.  We discussed the expected outcome after high-volume liposuction which includes lax skin.  I do not believe that she would be a good candidate for this however she may seek that care at another facility.  We did discuss panniculectomy.  The patient is an acceptable candidate  for a panniculectomy.  I showed her the location of the scars and what the panniculectomy would would not address.  This includes only fat and skin below the umbilicus will not change the contour of the abdomen above the umbilicus.  As her pannus does not extend below the symphysis pubis and she has no history of rashes it is unlikely this will be covered by insurance but I will submit for that consideration.  Santiago Glad 02/05/2023, 4:33 PM

## 2023-03-02 ENCOUNTER — Encounter: Payer: Self-pay | Admitting: *Deleted

## 2023-03-02 NOTE — Progress Notes (Signed)
Pt attended 12/22/22 screening event where b/p was 147/96; during follow up for that event, pt noted she did have a PCP and that she would monitor her b/p and call PCP if b/p remained elevated. Chart review indicates today that pt was seen in specialty physician office on 02/05/23, where her b/p was improved and was 136/86. Call made to pt to verify she still has access to PCP and message left since unable to contact pt by phone. Chart review does not indicate PCP name and [facility] review does not reveal PCP visit documentation. Letter sent to pt offering [facility] and [facility] flyer in case PCP access needed.

## 2023-03-31 ENCOUNTER — Telehealth: Payer: Self-pay | Admitting: *Deleted

## 2023-03-31 NOTE — Telephone Encounter (Signed)
Notified patient that ins denied panniculectomy and reminded her that we cannot submit breast reduction until she has completed physical therapy. Pt voiced understanding.

## 2023-07-01 ENCOUNTER — Encounter: Payer: Self-pay | Admitting: *Deleted

## 2023-07-01 NOTE — Progress Notes (Signed)
Pt attended 12/22/2022 screening event where her b/p was 147/96 and her blood sugar was 87. At the event, the pt did not document an address or phone number or PCP or insurance and did not identify any SDOH insecurities. During the initial event f/u, pt told the health equity team member that she did have a PCP and would monitor her b/p, watch her salt intake and f/u with PCP if if her b/p continued to be elevated. During the 60 day and this 6 month f/u, health equity team member has been unable to contact pt by phone and chart review reflected a specialty visit and telephone call but no PCP or other [facility] visible encounters. Chart review also indicates that pt's b/p at 02/05/23 office visit was 136/86. Since pt has stated she has a PCP but has not shared PCP name or info, and did not identify any SDOH and most recent b/p was WNL, no additional health equity team support scheduled at this time.
